# Patient Record
Sex: Female | Born: 1940 | Race: White | Hispanic: No | Marital: Married | Smoking: Former smoker
Health system: Southern US, Community
[De-identification: ages and names within clinical notes are randomized; demographics above are authoritative.]

## PROBLEM LIST (undated history)

## (undated) DIAGNOSIS — I10 Essential (primary) hypertension: Secondary | ICD-10-CM

## (undated) HISTORY — DX: Essential (primary) hypertension: I10

## (undated) HISTORY — PX: BREAST BIOPSY: SHX20

## (undated) HISTORY — PX: TONSILLECTOMY: SUR1361

## (undated) HISTORY — PX: CATARACT EXTRACTION: SUR2

---

## 2017-11-22 ENCOUNTER — Other Ambulatory Visit: Payer: Self-pay | Admitting: Internal Medicine

## 2017-11-22 DIAGNOSIS — R7989 Other specified abnormal findings of blood chemistry: Secondary | ICD-10-CM

## 2017-11-29 ENCOUNTER — Other Ambulatory Visit: Payer: Self-pay | Admitting: Internal Medicine

## 2017-11-29 ENCOUNTER — Ambulatory Visit
Admission: RE | Admit: 2017-11-29 | Discharge: 2017-11-29 | Disposition: A | Discharge status: Home / Self Care | Payer: Medicare HMO | Source: Ambulatory Visit | Attending: Internal Medicine | Admitting: Internal Medicine

## 2017-11-29 DIAGNOSIS — R7989 Other specified abnormal findings of blood chemistry: Secondary | ICD-10-CM

## 2017-12-05 ENCOUNTER — Other Ambulatory Visit: Payer: Self-pay | Admitting: Internal Medicine

## 2017-12-05 DIAGNOSIS — R9389 Abnormal findings on diagnostic imaging of other specified body structures: Secondary | ICD-10-CM

## 2017-12-05 DIAGNOSIS — K1120 Sialoadenitis, unspecified: Secondary | ICD-10-CM

## 2017-12-09 ENCOUNTER — Other Ambulatory Visit: Payer: Medicare HMO

## 2017-12-28 ENCOUNTER — Ambulatory Visit
Admission: RE | Admit: 2017-12-28 | Discharge: 2017-12-28 | Disposition: A | Discharge status: Home / Self Care | Payer: Medicare HMO | Source: Ambulatory Visit | Attending: Internal Medicine | Admitting: Internal Medicine

## 2017-12-28 DIAGNOSIS — R9389 Abnormal findings on diagnostic imaging of other specified body structures: Secondary | ICD-10-CM

## 2017-12-28 DIAGNOSIS — K1120 Sialoadenitis, unspecified: Secondary | ICD-10-CM

## 2017-12-28 MED ORDER — IOPAMIDOL (ISOVUE-300) INJECTION 61%
75.0000 mL | Freq: Once | INTRAVENOUS | Status: AC | PRN
  Administered 2017-12-28: 75 mL via INTRAVENOUS

## 2018-01-25 ENCOUNTER — Other Ambulatory Visit: Payer: Medicare HMO

## 2018-05-30 ENCOUNTER — Other Ambulatory Visit: Payer: Self-pay | Admitting: Internal Medicine

## 2018-05-30 DIAGNOSIS — Z1231 Encounter for screening mammogram for malignant neoplasm of breast: Secondary | ICD-10-CM

## 2018-07-03 ENCOUNTER — Ambulatory Visit
Admission: RE | Admit: 2018-07-03 | Discharge: 2018-07-03 | Disposition: A | Discharge status: Home / Self Care | Payer: Medicare HMO | Source: Ambulatory Visit | Attending: Internal Medicine | Admitting: Internal Medicine

## 2018-07-03 DIAGNOSIS — Z1231 Encounter for screening mammogram for malignant neoplasm of breast: Secondary | ICD-10-CM

## 2018-07-06 ENCOUNTER — Other Ambulatory Visit: Payer: Self-pay | Admitting: Internal Medicine

## 2018-07-06 DIAGNOSIS — R928 Other abnormal and inconclusive findings on diagnostic imaging of breast: Secondary | ICD-10-CM

## 2018-07-10 ENCOUNTER — Other Ambulatory Visit: Payer: Self-pay | Admitting: Internal Medicine

## 2018-07-10 ENCOUNTER — Ambulatory Visit
Admission: RE | Admit: 2018-07-10 | Discharge: 2018-07-10 | Disposition: A | Discharge status: Home / Self Care | Payer: Medicare HMO | Source: Ambulatory Visit | Attending: Internal Medicine | Admitting: Internal Medicine

## 2018-07-10 DIAGNOSIS — R928 Other abnormal and inconclusive findings on diagnostic imaging of breast: Secondary | ICD-10-CM

## 2018-07-10 DIAGNOSIS — N6489 Other specified disorders of breast: Secondary | ICD-10-CM

## 2018-10-08 ENCOUNTER — Other Ambulatory Visit: Payer: Self-pay | Admitting: Cardiology

## 2018-10-08 NOTE — Telephone Encounter (Signed)
° ° °  1. Which medications need to be refilled? (please list name of each medication and dose if known) losartan 100mg  hydrochlorothiazide 1QD  2. Which pharmacy/location (including street and city if local pharmacy) is medication to be sent to? [facility] [address] [place]  3. Do they need a 30 day or 90 day supply? 30

## 2018-10-09 MED ORDER — LOSARTAN POTASSIUM-HCTZ 100-25 MG PO TABS: 1.0000 | ORAL_TABLET | Freq: Every day | ORAL | 5 refills | Status: DC

## 2018-10-09 NOTE — Telephone Encounter (Signed)
Refill for losartan-hydrochlorothiazide sent [facility] [place] as requested. Patient is not due for a follow up appointment until 03/2019.

## 2018-10-10 ENCOUNTER — Telehealth: Payer: Self-pay | Admitting: Cardiology

## 2018-10-10 MED ORDER — LOSARTAN POTASSIUM-HCTZ 100-25 MG PO TABS: 1.0000 | ORAL_TABLET | Freq: Every day | ORAL | 5 refills | Status: DC

## 2018-10-10 NOTE — Telephone Encounter (Signed)
[facility] to [facility]  Patient is due to be seen in follow up in May 2020.

## 2019-01-08 ENCOUNTER — Other Ambulatory Visit: Payer: Self-pay | Admitting: Cardiology

## 2019-01-08 MED ORDER — AMLODIPINE BESYLATE 5 MG PO TABS: 5.0000 mg | ORAL_TABLET | Freq: Every day | ORAL | 0 refills | Status: DC

## 2019-01-08 NOTE — Telephone Encounter (Signed)
Has been given to Dr. Krasowski for him to review  

## 2019-01-08 NOTE — Telephone Encounter (Signed)
° ° ° °  1. Which medications need to be refilled? (please list name of each medication and dose if known) Amlodipine 5mg  once daily  2. Which pharmacy/location (including street and city if local pharmacy) is medication to be sent to? [facility] on [address]  3. Do they need a 30 day or 90 day supply? 90

## 2019-01-08 NOTE — Telephone Encounter (Signed)
Amlodipine 5 mg daily refilled for 90 days per Dr. Bing Matter

## 2019-01-09 ENCOUNTER — Ambulatory Visit
Admission: RE | Admit: 2019-01-09 | Discharge: 2019-01-09 | Disposition: A | Discharge status: Home / Self Care | Payer: Medicare HMO | Source: Ambulatory Visit | Attending: Internal Medicine | Admitting: Internal Medicine

## 2019-01-09 ENCOUNTER — Other Ambulatory Visit: Payer: Self-pay

## 2019-01-09 ENCOUNTER — Other Ambulatory Visit: Payer: Self-pay | Admitting: Internal Medicine

## 2019-01-09 DIAGNOSIS — N6489 Other specified disorders of breast: Secondary | ICD-10-CM

## 2019-01-29 ENCOUNTER — Telehealth: Payer: Self-pay | Admitting: Cardiology

## 2019-01-29 NOTE — Telephone Encounter (Signed)
°*  STAT* If patient is at the pharmacy, call can be transferred to refill team.   1. Which medications need to be refilled? (please list name of each medication and dose if known) Losarten and Amlodipine 2. Which pharmacy/location (including street and city if local pharmacy) is medication to be sent to? [facility], [place] 3. Do they need a 30 day or 90 day supply? 90 days  **Please note this reflects a change in patient's pharmacy

## 2019-01-29 NOTE — Telephone Encounter (Signed)
Pt calling requesting a refill on losartan-HCTZ and amlodipine, [facility] day supply [facility]. Pt would like a call concerning this matter. Please address

## 2019-01-30 MED ORDER — LOSARTAN POTASSIUM-HCTZ 100-25 MG PO TABS: 1.0000 | ORAL_TABLET | Freq: Every day | ORAL | 1 refills | Status: DC

## 2019-01-30 MED ORDER — AMLODIPINE BESYLATE 5 MG PO TABS: 5.0000 mg | ORAL_TABLET | Freq: Every day | ORAL | 1 refills | Status: DC

## 2019-01-30 NOTE — Telephone Encounter (Signed)
Refills sent to [facility] in [place] as requested.

## 2019-03-07 ENCOUNTER — Telehealth: Payer: Self-pay | Admitting: Cardiology

## 2019-03-07 NOTE — Telephone Encounter (Signed)
Virtual Visit Pre-Appointment Phone Call  "(Name), I am calling you today to discuss your upcoming appointment. We are currently trying to limit exposure to the virus that causes COVID-19 by seeing patients at home rather than in the office."  1. "What is the BEST phone number to call the day of the visit?" - include this in appointment notes  2. Do you have or have access to (through a family member/friend) a smartphone with video capability that we can use for your visit?" a. If yes - list this number in appt notes as cell (if different from BEST phone #) and list the appointment type as a VIDEO visit in appointment notes b. If no - list the appointment type as a PHONE visit in appointment notes  3. Confirm consent - "In the setting of the current Covid19 crisis, you are scheduled for a (phone or video) visit with your provider on (date) at (time).  Just as we do with many in-office visits, in order for you to participate in this visit, we must obtain consent.  If you'd like, I can send this to your mychart (if signed up) or email for you to review.  Otherwise, I can obtain your verbal consent now.  All virtual visits are billed to your insurance company just like a normal visit would be.  By agreeing to a virtual visit, we'd like you to understand that the technology does not allow for your provider to perform an examination, and thus may limit your provider's ability to fully assess your condition. If your provider identifies any concerns that need to be evaluated in person, we will make arrangements to do so.  Finally, though the technology is pretty good, we cannot assure that it will always work on either your or our end, and in the setting of a video visit, we may have to convert it to a phone-only visit.  In either situation, we cannot ensure that we have a secure connection.  Are you willing to proceed?" STAFF: Did the patient verbally acknowledge consent to telehealth visit? Document  YES/NO here: Yes  4. Advise patient to be prepared - "Two hours prior to your appointment, go ahead and check your blood pressure, pulse, oxygen saturation, and your weight (if you have the equipment to check those) and write them all down. When your visit starts, your provider will ask you for this information. If you have an Apple Watch or Kardia device, please plan to have heart rate information ready on the day of your appointment. Please have a pen and paper handy nearby the day of the visit as well."  5. Give patient instructions for MyChart download to smartphone OR Doximity/Doxy.me as below if video visit (depending on what platform provider is using)  6. Inform patient they will receive a phone call 15 minutes prior to their appointment time (may be from unknown caller ID) so they should be prepared to answer    TELEPHONE CALL NOTE  Rhonda Sherman has been deemed a candidate for a follow-up tele-health visit to limit community exposure during the Covid-19 pandemic. I spoke with the patient via phone to ensure availability of phone/video source, confirm preferred email & phone number, and discuss instructions and expectations.  I reminded Rhonda Sherman to be prepared with any vital sign and/or heart rhythm information that could potentially be obtained via home monitoring, at the time of her visit. I reminded Rhonda Sherman to expect a phone  call prior to her visit.  Rhonda Sherman 03/07/2019 12:37 PM   INSTRUCTIONS FOR DOWNLOADING THE MYCHART APP TO SMARTPHONE  - The patient must first make sure to have activated MyChart and know their login information - If Apple, go to [employer] and type in MyChart in the search bar and download the app. If Android, ask patient to go to [employer] Play Store and type in [place] in the search bar and download the app. The app is free but as with any other app downloads, their phone may require them to verify saved payment information or Apple/Android  password.  - The patient will need to then log into the app with their MyChart username and password, and select [facility] as their healthcare provider to link the account. When it is time for your visit, go to the MyChart app, find appointments, and click Begin Video Visit. Be sure to Select Allow for your device to access the Microphone and Camera for your visit. You will then be connected, and your provider will be with you shortly.  **If they have any issues connecting, or need assistance please contact MyChart service desk [phone])**  **If using a computer, in order to ensure the best quality for their visit they will need to use either of the following Internet Browsers: [employer] Edge, or Google Chrome**  IF USING DOXIMITY or DOXY.ME - The patient will receive a link just prior to their visit by text.     FULL LENGTH CONSENT FOR TELE-HEALTH VISIT   I hereby voluntarily request, consent and authorize [facility] and its employed or contracted physicians, physician assistants, nurse practitioners or other licensed health care professionals (the Practitioner), to provide me with telemedicine health care services (the Services") as deemed necessary by the treating Practitioner. I acknowledge and consent to receive the Services by the Practitioner via telemedicine. I understand that the telemedicine visit will involve communicating with the Practitioner through live audiovisual communication technology and the disclosure of certain medical information by electronic transmission. I acknowledge that I have been given the opportunity to request an in-person assessment or other available alternative prior to the telemedicine visit and am voluntarily participating in the telemedicine visit.  I understand that I have the right to withhold or withdraw my consent to the use of telemedicine in the course of my care at any time, without affecting my right to future care or treatment,  and that the Practitioner or I may terminate the telemedicine visit at any time. I understand that I have the right to inspect all information obtained and/or recorded in the course of the telemedicine visit and may receive copies of available information for a reasonable fee.  I understand that some of the potential risks of receiving the Services via telemedicine include:   Delay or interruption in medical evaluation due to technological equipment failure or disruption;  Information transmitted may not be sufficient (e.g. poor resolution of images) to allow for appropriate medical decision making by the Practitioner; and/or   In rare instances, security protocols could fail, causing a breach of personal health information.  Furthermore, I acknowledge that it is my responsibility to provide information about my medical history, conditions and care that is complete and accurate to the best of my ability. I acknowledge that Practitioner's advice, recommendations, and/or decision may be based on factors not within their control, such as incomplete or inaccurate data provided by me or distortions of diagnostic images or specimens that may result from  electronic transmissions. I understand that the practice of medicine is not an exact science and that Practitioner makes no warranties or guarantees regarding treatment outcomes. I acknowledge that I will receive a copy of this consent concurrently upon execution via email to the email address I last provided but may also request a printed copy by calling the office of [facility].    I understand that my insurance will be billed for this visit.   I have read or had this consent read to me.  I understand the contents of this consent, which adequately explains the benefits and risks of the Services being provided via telemedicine.   I have been provided ample opportunity to ask questions regarding this consent and the Services and have had my questions  answered to my satisfaction.  I give my informed consent for the services to be provided through the use of telemedicine in my medical care  By participating in this telemedicine visit I agree to the above.

## 2019-03-26 ENCOUNTER — Telehealth (INDEPENDENT_AMBULATORY_CARE_PROVIDER_SITE_OTHER): Payer: Medicare HMO | Admitting: Cardiology

## 2019-03-26 ENCOUNTER — Other Ambulatory Visit: Payer: Self-pay

## 2019-03-26 ENCOUNTER — Encounter: Payer: Self-pay | Admitting: Cardiology

## 2019-03-26 DIAGNOSIS — I10 Essential (primary) hypertension: Secondary | ICD-10-CM

## 2019-03-26 DIAGNOSIS — I119 Hypertensive heart disease without heart failure: Secondary | ICD-10-CM

## 2019-03-26 DIAGNOSIS — R0609 Other forms of dyspnea: Secondary | ICD-10-CM

## 2019-03-26 NOTE — Progress Notes (Signed)
Virtual Visit via Video Note   This visit type was conducted due to national recommendations for restrictions regarding the COVID-19 Pandemic (e.g. social distancing) in an effort to limit this patient's exposure and mitigate transmission in our community.  Due to her co-morbid illnesses, this patient is at least at moderate risk for complications without adequate follow up.  This format is felt to be most appropriate for this patient at this time.  All issues noted in this document were discussed and addressed.  A limited physical exam was performed with this format.  Please refer to the patient's chart for her consent to telehealth for [facility].  Evaluation Performed:  Follow-up visit  This visit type was conducted due to national recommendations for restrictions regarding the COVID-19 Pandemic (e.g. social distancing).  This format is felt to be most appropriate for this patient at this time.  All issues noted in this document were discussed and addressed.  No physical exam was performed (except for noted visual exam findings with Video Visits).  Please refer to the patient's chart (MyChart message for video visits and phone note for telephone visits) for the patient's consent to telehealth for [facility].  Date:  03/26/2019  ID: Lemma Trainer, DOB [id number], MRN [id number]   Patient Location: [address] [place] [address]   Provider location:   [facility] [place] [facility]  PCP:  Kendrick Ranch, MD  Cardiologist:  Gypsy Balsam, MD     Chief Complaint: Doing well  History of Present Illness:    Akshata Dickinson is a 78 y.o. female  who presents via audio/video conferencing for a telehealth visit today.  She is a patient of Dr. Donnie Aho with past medical history significant for hypertension, hypertensive heart disease.  We had a long discussion about her blood pressure she is afraid to check her blood pressure check is very rarely when she check her  typical numbers she sees 135 and above systolic.  Overall doing very well she exercise on a regular basis she is upset about before but now she cannot do her exercises because Fayrene Fearing a close.  In spite of her age 53 she is doing amazingly well.   The patient does not have symptoms concerning for COVID-19 infection (fever, chills, cough, or new SHORTNESS OF BREATH).    Prior CV studies:   The following studies were reviewed today:       Past Medical History:  Diagnosis Date  . Hypertension     Past Surgical History:  Procedure Laterality Date  . BREAST BIOPSY Right   . CATARACT EXTRACTION    . TONSILLECTOMY       Current Meds  Medication Sig  . amLODipine (NORVASC) 5 MG tablet Take 1 tablet (5 mg total) by mouth daily.  . Calcium Carbonate-Vit D-Min (CALCIUM 1200 PO) Take 1 tablet by mouth every other day.  . losartan-hydrochlorothiazide (HYZAAR) 100-25 MG tablet Take 1 tablet by mouth daily.  . Multiple Vitamins-Minerals (PRESERVISION AREDS 2 PO) Take 1 capsule by mouth daily.      Family History: The patient's family history includes Lung cancer in her father; Pancreatic cancer in her mother.   ROS:   Please see the history of present illness.     All other systems reviewed and are negative.   Labs/Other Tests and Data Reviewed:     Recent Labs: No results found for requested labs within last 8760 hours.  Recent Lipid Panel No results found  for: CHOL, TRIG, HDL, CHOLHDL, VLDL, LDLCALC, LDLDIRECT    Exam:    Vital Signs:  There were no vitals taken for this visit.    Wt Readings from Last 3 Encounters:  No data found for Wt     Well nourished, well developed in no acute distress. Alert awake and at the time 3 very happy to be able to talk to me her husband is helping us with this conversation.  Diagnosis for this visit:   1. Hypertensive heart disease without CHF (congestive heart failure)   2. Essential hypertension   3. Dyspnea on exertion       ASSESSMENT & PLAN:    1.  Essential hypertension asked her to check her blood pressure on a regular basis will call her in 2 weeks and ask her what her blood pressure is.  I will schedule her to have echocardiogram to assess left ventricular ejection fraction.  Based on that we decide about aggressiveness of the medication therapy.  In the meantime we will continue present management. 2.  Hypertensive heart disease echocardiogram will be done. 3.  Dyspnea on exertion doing well from that point of view.  COVID-19 Education: The signs and symptoms of COVID-19 were discussed with the patient and how to seek care for testing (follow up with PCP or arrange E-visit).  The importance of social distancing was discussed today.  Patient Risk:   After full review of this patients clinical status, I feel that they are at least moderate risk at this time.  Time:   Today, I have spent 22 minutes with the patient with telehealth technology discussing pt health issues.  I spent 5 minutes reviewing her chart before the visit.  Visit was finished at 2:41 PM.    Medication Adjustments/Labs and Tests Ordered: Current medicines are reviewed at length with the patient today.  Concerns regarding medicines are outlined above.  No orders of the defined types were placed in this encounter.  Medication changes: No orders of the defined types were placed in this encounter.    Disposition: Follow-up 12 months  Signed, Georgeanna Leaobert J. , MD, [facility] 03/26/2019 2:42 PM    [facility]

## 2019-03-26 NOTE — Patient Instructions (Signed)
Medication Instructions:  Your physician recommends that you continue on your current medications as directed. Please refer to the Current Medication list given to you today.  If you need a refill on your cardiac medications before your next appointment, please call your pharmacy.   Lab work: None.  If you have labs (blood work) drawn today and your tests are completely normal, you will receive your results only by: . MyChart Message (if you have MyChart) OR . A paper copy in the mail If you have any lab test that is abnormal or we need to change your treatment, we will call you to review the results.  Testing/Procedures: Your physician has requested that you have an echocardiogram. Echocardiography is a painless test that uses sound waves to create images of your heart. It provides your doctor with information about the size and shape of your heart and how well your heart's chambers and valves are working. This procedure takes approximately one hour. There are no restrictions for this procedure.    Follow-Up: At [facility], you and your health needs are our priority.  As part of our continuing mission to provide you with exceptional heart care, we have created designated Provider Care Teams.  These Care Teams include your primary Cardiologist (physician) and Advanced Practice Providers (APPs -  Physician Assistants and Nurse Practitioners) who all work together to provide you with the care you need, when you need it. You will need a follow up appointment in 1 years.  Please call our office 2 months in advance to schedule this appointment.  You may see No primary care provider on file. or another member of our [facility] Provider Team in [place]: Brian Munley, MD . Rajan Revankar, MD  Any Other Special Instructions Will Be Listed Below (If Applicable).   Echocardiogram An echocardiogram is a procedure that uses painless sound waves (ultrasound) to produce an image of the heart.  Images from an echocardiogram can provide important information about:  Signs of coronary artery disease (CAD).  Aneurysm detection. An aneurysm is a weak or damaged part of an artery wall that bulges out from the normal force of blood pumping through the body.  Heart size and shape. Changes in the size or shape of the heart can be associated with certain conditions, including heart failure, aneurysm, and CAD.  Heart muscle function.  Heart valve function.  Signs of a past heart attack.  Fluid buildup around the heart.  Thickening of the heart muscle.  A tumor or infectious growth around the heart valves. Tell a health care provider about:  Any allergies you have.  All medicines you are taking, including vitamins, herbs, eye drops, creams, and over-the-counter medicines.  Any blood disorders you have.  Any surgeries you have had.  Any medical conditions you have.  Whether you are pregnant or may be pregnant. What are the risks? Generally, this is a safe procedure. However, problems may occur, including:  Allergic reaction to dye (contrast) that may be used during the procedure. What happens before the procedure? No specific preparation is needed. You may eat and drink normally. What happens during the procedure?   An IV tube may be inserted into one of your veins.  You may receive contrast through this tube. A contrast is an injection that improves the quality of the pictures from your heart.  A gel will be applied to your chest.  A wand-like tool (transducer) will be moved over your chest. The gel will help to   transmit the sound waves from the transducer.  The sound waves will harmlessly bounce off of your heart to allow the heart images to be captured in real-time motion. The images will be recorded on a computer. The procedure may vary among health care providers and hospitals. What happens after the procedure?  You may return to your normal, everyday life,  including diet, activities, and medicines, unless your health care provider tells you not to do that. Summary  An echocardiogram is a procedure that uses painless sound waves (ultrasound) to produce an image of the heart.  Images from an echocardiogram can provide important information about the size and shape of your heart, heart muscle function, heart valve function, and fluid buildup around your heart.  You do not need to do anything to prepare before this procedure. You may eat and drink normally.  After the echocardiogram is completed, you may return to your normal, everyday life, unless your health care provider tells you not to do that. This information is not intended to replace advice given to you by your health care provider. Make sure you discuss any questions you have with your health care provider. Document Released: 10/14/2000 Document Revised: 11/19/2016 Document Reviewed: 11/19/2016 Elsevier Interactive Patient Education  2019 Elsevier Inc.    

## 2019-04-02 ENCOUNTER — Other Ambulatory Visit (HOSPITAL_BASED_OUTPATIENT_CLINIC_OR_DEPARTMENT_OTHER): Payer: Medicare HMO

## 2019-04-10 ENCOUNTER — Other Ambulatory Visit: Payer: Self-pay | Admitting: Cardiology

## 2019-04-10 MED ORDER — AMLODIPINE BESYLATE 5 MG PO TABS: 5.0000 mg | ORAL_TABLET | Freq: Every day | ORAL | 1 refills | Status: DC

## 2019-04-10 MED ORDER — AMLODIPINE BESYLATE 5 MG PO TABS: 5.0000 mg | ORAL_TABLET | Freq: Every day | ORAL | 1 refills | Status: AC

## 2019-04-10 MED ORDER — LOSARTAN POTASSIUM-HCTZ 100-25 MG PO TABS: 1.0000 | ORAL_TABLET | Freq: Every day | ORAL | 1 refills | Status: DC

## 2019-04-10 MED ORDER — LOSARTAN POTASSIUM-HCTZ 100-25 MG PO TABS: 1.0000 | ORAL_TABLET | Freq: Every day | ORAL | 1 refills | Status: AC

## 2019-04-10 NOTE — Telephone Encounter (Signed)
°*  STAT* If patient is at the pharmacy, call can be transferred to refill team.   1. Which medications need to be refilled? (please list name of each medication and dose if known) Amlodipine 5mg  tablets once daily; Losartan 100-25mg  tablet once daily  2. Which pharmacy/location (including street and city if local pharmacy) is medication to be sent to? [facility] on [address] [place]  3. Do they need a 30 day or 90 day supply? [place]

## 2019-04-10 NOTE — Telephone Encounter (Signed)
Refills have been sent in. 

## 2019-05-01 ENCOUNTER — Ambulatory Visit (HOSPITAL_BASED_OUTPATIENT_CLINIC_OR_DEPARTMENT_OTHER)
Admission: RE | Admit: 2019-05-01 | Discharge: 2019-05-01 | Disposition: A | Discharge status: Home / Self Care | Payer: Medicare HMO | Source: Ambulatory Visit | Attending: Cardiology | Admitting: Cardiology

## 2019-05-01 ENCOUNTER — Other Ambulatory Visit: Payer: Self-pay

## 2019-05-01 DIAGNOSIS — R0609 Other forms of dyspnea: Secondary | ICD-10-CM

## 2019-05-01 NOTE — Progress Notes (Signed)
  Echocardiogram 2D Echocardiogram has been performed.  Rhonda Sherman 05/01/2019, 11:48 AM

## 2019-05-07 ENCOUNTER — Telehealth: Payer: Self-pay | Admitting: *Deleted

## 2019-05-07 NOTE — Telephone Encounter (Signed)
-----   Message from Park Liter, MD sent at 05/07/2019  8:29 AM EDT ----- Normal LVEF  Overall echo looks normal

## 2019-05-07 NOTE — Telephone Encounter (Signed)
Telephone call to patient. Informed of normal echo results. Copy mailed to her per patient request.

## 2019-07-11 ENCOUNTER — Other Ambulatory Visit: Payer: Self-pay | Admitting: Gynecology

## 2019-07-11 DIAGNOSIS — N6489 Other specified disorders of breast: Secondary | ICD-10-CM

## 2019-07-15 ENCOUNTER — Ambulatory Visit
Admission: RE | Admit: 2019-07-15 | Discharge: 2019-07-15 | Disposition: A | Discharge status: Home / Self Care | Payer: Medicare HMO | Source: Ambulatory Visit | Attending: Internal Medicine | Admitting: Internal Medicine

## 2019-07-15 ENCOUNTER — Other Ambulatory Visit: Payer: Self-pay

## 2019-07-15 DIAGNOSIS — R922 Inconclusive mammogram: Secondary | ICD-10-CM | POA: Diagnosis not present

## 2019-07-15 DIAGNOSIS — N6489 Other specified disorders of breast: Secondary | ICD-10-CM

## 2019-08-21 IMAGING — MG DIGITAL DIAGNOSTIC UNILATERAL LEFT MAMMOGRAM WITH TOMO AND CAD
6 series · 6 of 18 positions shown · non-contrast
Comparison: Screening mammogram dated 07/03/2018.

CLINICAL DATA: Screening recall for possible left breast mass.

EXAM:
DIGITAL DIAGNOSTIC UNILATERAL LEFT MAMMOGRAM WITH CAD AND TOMO
LEFT BREAST ULTRASOUND

[L MLO synth-2D]
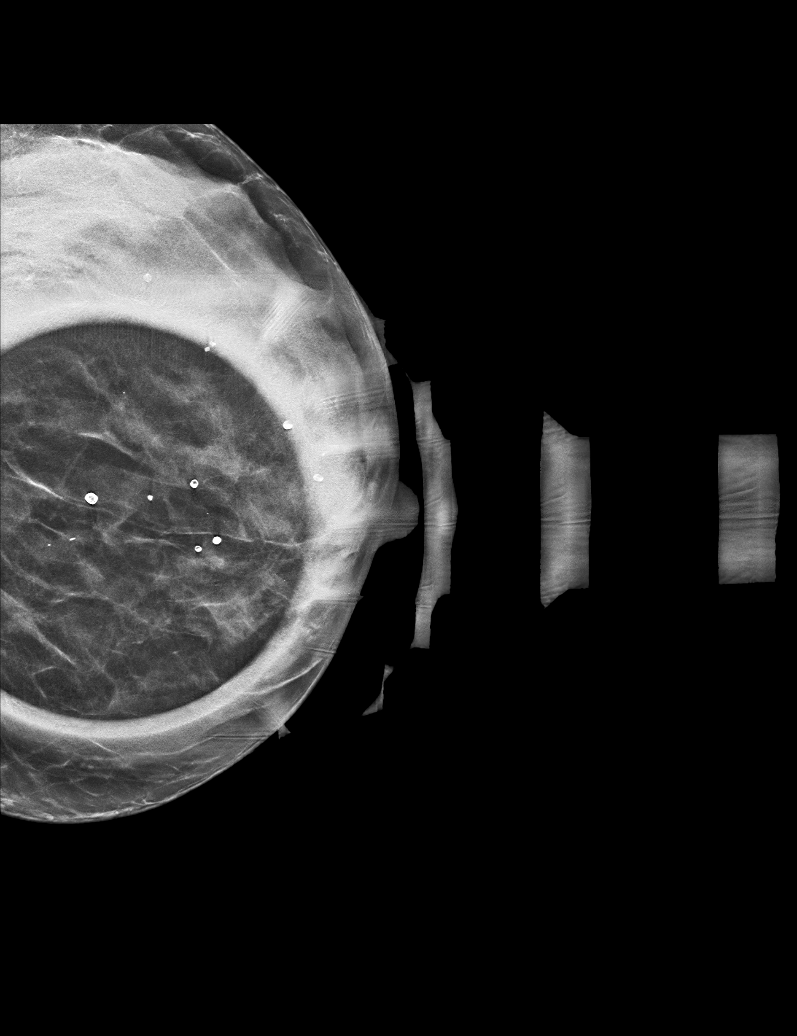

[L CC synth-2D (1 of 2)]
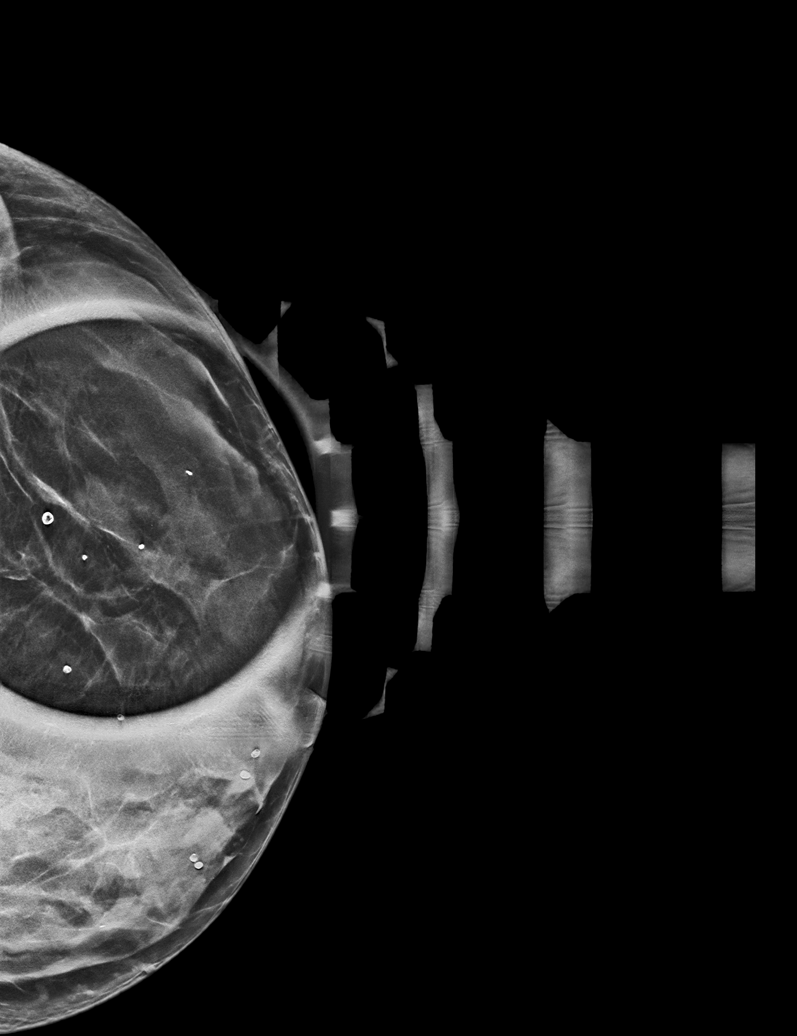

[L CC synth-2D (2 of 2)]
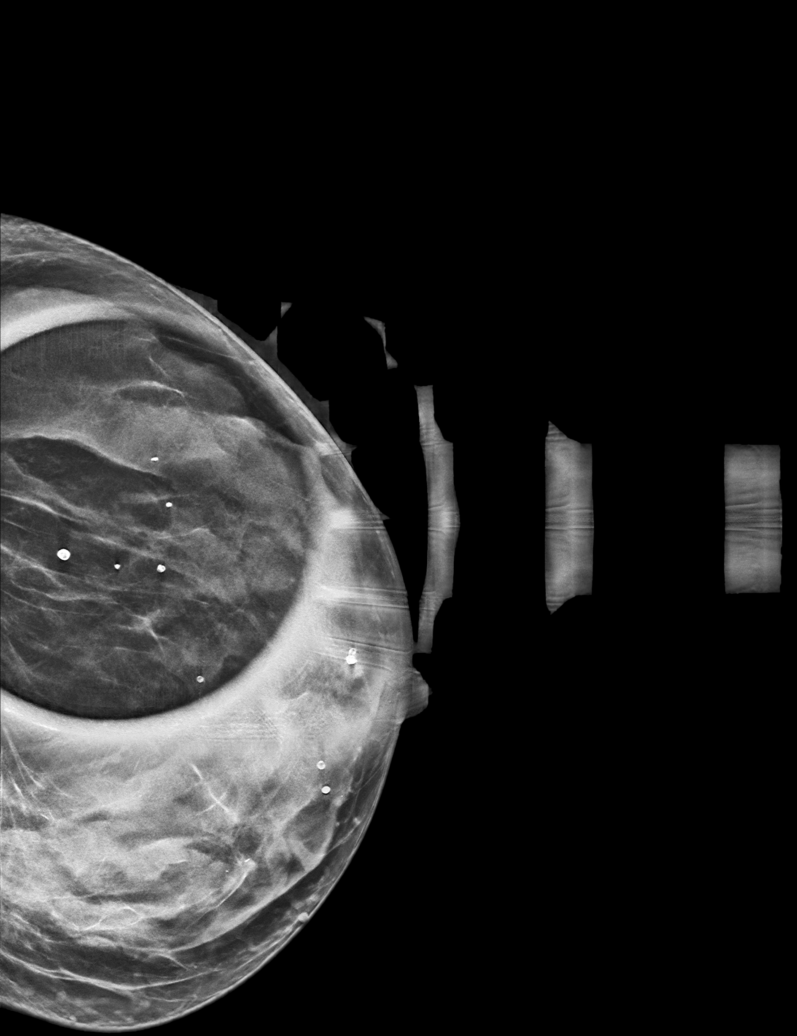

[L CC tomo (1 of 2) · tomo slice 25/48.0]
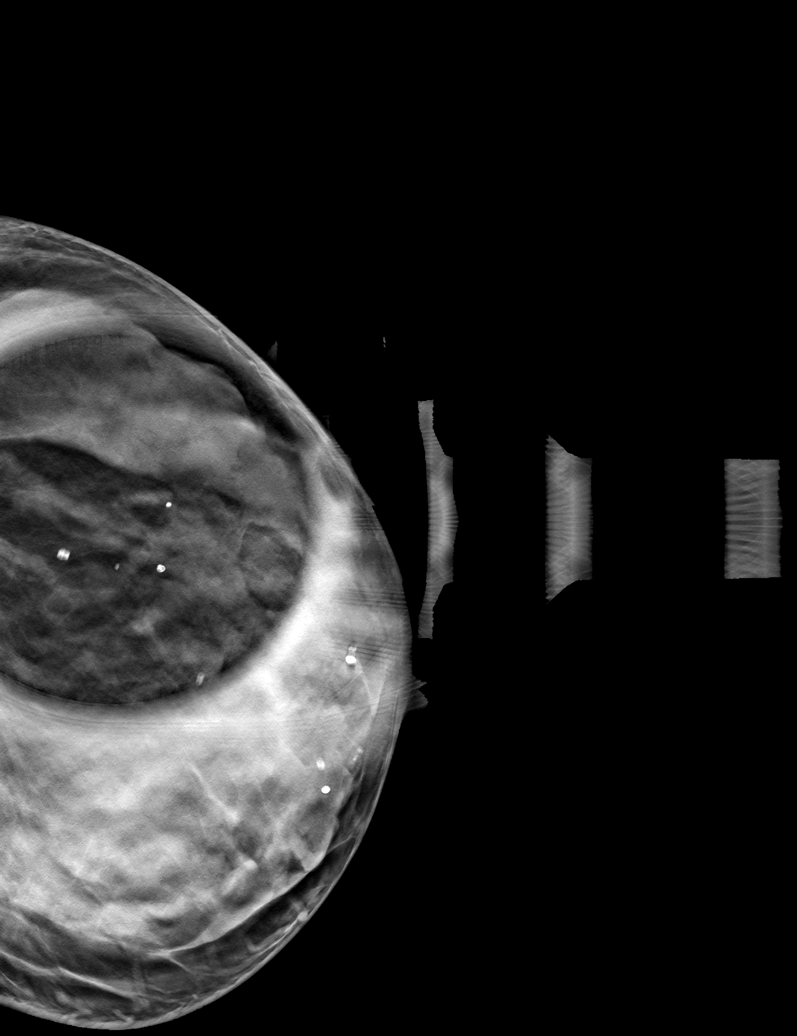

[L CC tomo (2 of 2) · tomo slice 23/45.0]
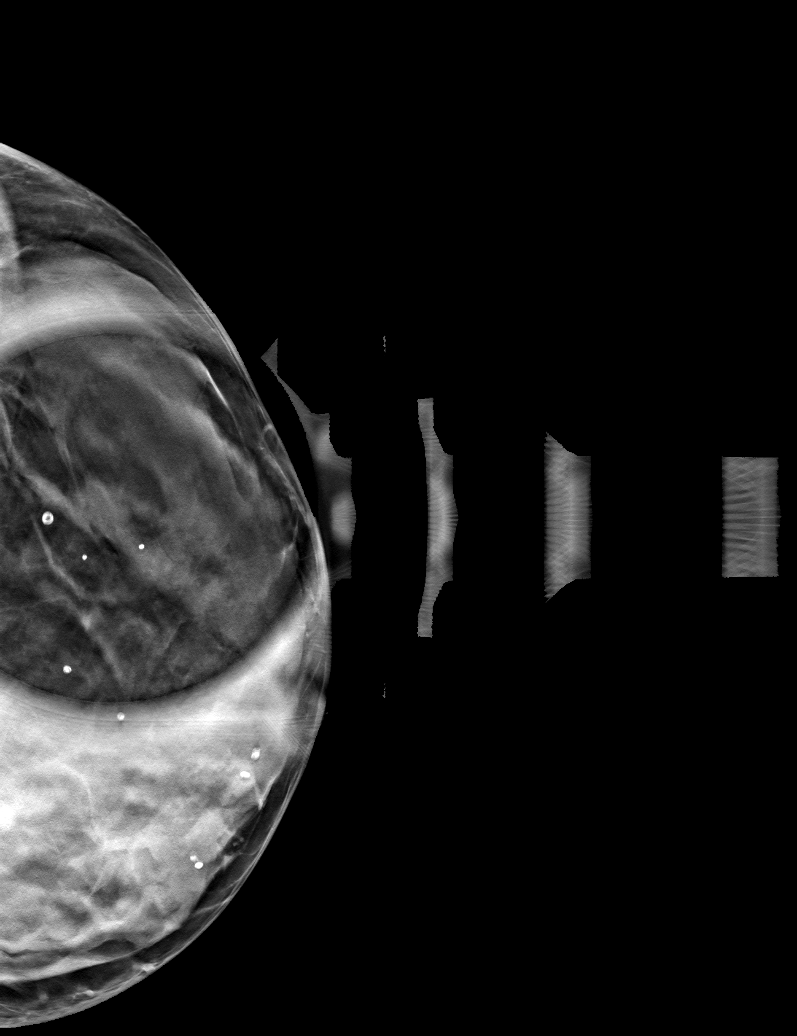

[L MLO tomo · tomo slice 23/44.0]
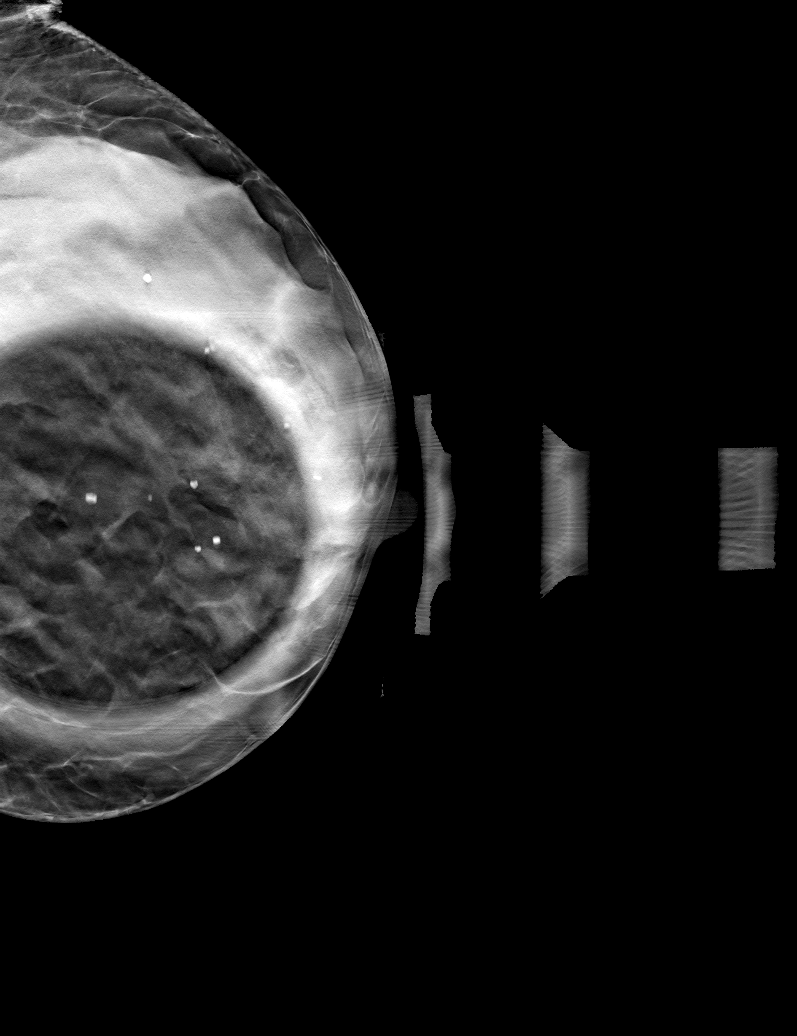

[6 of 18 positions shown; findings below may reference images not displayed]

Patient states
she has had prior mammograms in [place] 6-7 years prior, however
they could not be located for comparison.

ACR Breast Density Category c: The breast tissue is heterogeneously
dense, which may obscure small masses.
FINDINGS: Spot compression CC and MLO tomograms were performed of the left
breast. There is a persistent asymmetry in the lower outer left
breast best seen on the MLO tomograms which demonstrates imaging
features suggestive of an area of focal fibroglandular tissue.

Mammographic images were processed with CAD.

Physical examination of the lower outer left breast does not reveal
any palpable masses.

Targeted ultrasound of the entire lower and outer left breast was
performed. No suspicious masses or abnormality seen, only mildly
dilated ducts and heterogeneous fibroglandular tissue seen.
IMPRESSION: Probably benign left breast asymmetry seen on mammography.

RECOMMENDATION:
Diagnostic mammography of the left breast in 6 months with possible
left breast ultrasound.

I have discussed the findings and recommendations with the patient.
Results were also provided in writing at the conclusion of the
visit. If applicable, a reminder letter will be sent to the patient
regarding the next appointment.

BI-RADS CATEGORY  3: Probably benign.
